# Patient Record
Sex: Male | Born: 1965 | Race: White | Hispanic: No | Marital: Married | Smoking: Never smoker
Health system: Southern US, Community
[De-identification: ages and names within clinical notes are randomized; demographics above are authoritative.]

---

## 2016-03-02 ENCOUNTER — Emergency Department
Admission: EM | Admit: 2016-03-02 | Discharge: 2016-03-02 | Disposition: A | Discharge status: Home / Self Care | Payer: Self-pay | Attending: Emergency Medicine | Admitting: Emergency Medicine

## 2016-03-02 ENCOUNTER — Encounter: Payer: Self-pay | Admitting: Emergency Medicine

## 2016-03-02 DIAGNOSIS — J111 Influenza due to unidentified influenza virus with other respiratory manifestations: Secondary | ICD-10-CM | POA: Insufficient documentation

## 2016-03-02 MED ORDER — OSELTAMIVIR PHOSPHATE 75 MG PO CAPS: 75.0000 mg | ORAL_CAPSULE | Freq: Two times a day (BID) | ORAL | 0 refills | Status: AC

## 2016-03-02 MED ORDER — FLUTICASONE PROPIONATE 50 MCG/ACT NA SUSP: 1.0000 | Freq: Two times a day (BID) | NASAL | 0 refills | Status: AC

## 2016-03-02 MED ORDER — CETIRIZINE HCL 10 MG PO TABS: 10.0000 mg | ORAL_TABLET | Freq: Every day | ORAL | 0 refills | Status: AC

## 2016-03-02 MED ORDER — PSEUDOEPH-BROMPHEN-DM 30-2-10 MG/5ML PO SYRP: 10.0000 mL | ORAL_SOLUTION | Freq: Four times a day (QID) | ORAL | 0 refills | Status: AC | PRN

## 2016-03-02 NOTE — ED Notes (Signed)
See triage note  States he developed fever, body aches and congestion since last pm  Afebrile on arrival

## 2016-03-02 NOTE — ED Triage Notes (Signed)
Cough, fever and chills since yesterday

## 2016-03-02 NOTE — ED Provider Notes (Signed)
[facility] Emergency Department Provider Note  ____________________________________________  Time seen: Approximately 6:04 PM  I have reviewed the triage vital signs and the nursing notes.   HISTORY  Chief Complaint Cough    HPI Isaac Cruz is a 51 y.o. male who presents to emergency department complaining of sudden onset of fevers and chills, nasal congestion, cough, headache. Patient reports that yesterday evening all symptoms hit rapidly. Patient has had intermittent "burning up, versus "freezing". Patient is been taking Alka-Seltzer and Motrin for complaints. Patient reports that today at work he felt foggy headed and his body started to ache even more. He denies any visual changes, neck stiffness or pain, chest pain, shortness of breath, abdominal pain, nausea vomiting, diarrhea or constipation.   History reviewed. No pertinent past medical history.  There are no active problems to display for this patient.   History reviewed. No pertinent surgical history.  Prior to Admission medications   Medication Sig Start Date End Date Taking? Authorizing Provider  levothyroxine (SYNTHROID, LEVOTHROID) 100 MCG tablet Take 100 mcg by mouth daily before breakfast.   Yes Historical Provider, MD  brompheniramine-pseudoephedrine-DM 30-2-10 MG/5ML syrup Take 10 mLs by mouth 4 (four) times daily as needed. 03/02/16   Delorise Royals Cuthriell, PA-C  cetirizine (ZYRTEC) 10 MG tablet Take 1 tablet (10 mg total) by mouth daily. 03/02/16   Delorise Royals Cuthriell, PA-C  fluticasone (FLONASE) 50 MCG/ACT nasal spray Place 1 spray into both nostrils 2 (two) times daily. 03/02/16   Delorise Royals Cuthriell, PA-C  oseltamivir (TAMIFLU) 75 MG capsule Take 1 capsule (75 mg total) by mouth 2 (two) times daily. 03/02/16   Delorise Royals Cuthriell, PA-C    Allergies Patient has no known allergies.  No family history on file.  Social History Social History  Substance Use Topics  . Smoking  status: Never Smoker  . Smokeless tobacco: Not on file  . Alcohol use Not on file     Review of Systems  Constitutional: Positive fever/chills Eyes: No visual changes. No discharge ENT: Positive for nasal congestion. Cardiovascular: no chest pain. Respiratory: Positive cough. No SOB. Gastrointestinal: No abdominal pain.  No nausea, no vomiting.  No diarrhea.  No constipation. Musculoskeletal: Negative for musculoskeletal pain. Skin: Negative for rash, abrasions, lacerations, ecchymosis. Neurological: Negative for headaches, focal weakness or numbness. 10-point ROS otherwise negative.  ____________________________________________   PHYSICAL EXAM:  VITAL SIGNS: ED Triage Vitals  Enc Vitals Group     BP 03/02/16 1745 128/64     Pulse Rate 03/02/16 1745 83     Resp 03/02/16 1745 20     Temp 03/02/16 1745 98.7 F (37.1 C)     Temp Source 03/02/16 1745 Oral     SpO2 03/02/16 1745 95 %     Weight 03/02/16 1747 189 lb (85.7 kg)     Height 03/02/16 1747 5\' 5"  (1.651 m)     Head Circumference --      Peak Flow --      Pain Score 03/02/16 1747 9     Pain Loc --      Pain Edu? --      Excl. in GC? --      Constitutional: Alert and oriented. Well appearing and in no acute distress. Eyes: Conjunctivae are normal. PERRL. EOMI. Head: Atraumatic. ENT:      Ears: EACs and TMs unremarkable bilaterally.      Nose: Mild clear congestion/rhinnorhea.      Mouth/Throat: Mucous membranes are moist.  Neck: No stridor.  No cervical spine tenderness to palpation. Hematological/Lymphatic/Immunilogical: No cervical lymphadenopathy. Cardiovascular: Normal rate, regular rhythm. Normal S1 and S2.  Good peripheral circulation. Respiratory: Normal respiratory effort without tachypnea or retractions. Lungs CTAB. Good air entry to the bases with no decreased or absent breath sounds. Musculoskeletal: Full range of motion to all extremities. No gross deformities appreciated. Neurologic:  Normal  speech and language. No gross focal neurologic deficits are appreciated.  Skin:  Skin is warm, dry and intact. No rash noted. Psychiatric: Mood and affect are normal. Speech and behavior are normal. Patient exhibits appropriate insight and judgement.   ____________________________________________   LABS (all labs ordered are listed, but only abnormal results are displayed)  Labs Reviewed - No data to display ____________________________________________  EKG   ____________________________________________  RADIOLOGY   No results found.  ____________________________________________    PROCEDURES  Procedure(s) performed:    Procedures    Medications - No data to display   ____________________________________________   INITIAL IMPRESSION / ASSESSMENT AND PLAN / ED COURSE  Pertinent labs & imaging results that were available during my care of the patient were reviewed by me and considered in my medical decision making (see chart for details).  Review of the [place] CSRS was performed in accordance of the [facility] prior to dispensing any controlled drugs.     Patient's diagnosis is consistent with influenza. Patient has some onset of influenza-like symptoms. At this time, no testing is undertaken. Patient will be treated symptomatically. He is given a prescription for Tamiflu, Bromfed, Flonase, Zyrtec. He is encouraged to use Tylenol and Motrin at home for further symptom control. Patient has been given strict instructions to maintain good oral intake of liquids throughout this process. Patient will follow-up with primary care as needed..  Patient is given ED precautions to return to the ED for any worsening or new symptoms.     ____________________________________________  FINAL CLINICAL IMPRESSION(S) / ED DIAGNOSES  Final diagnoses:  Influenza      NEW MEDICATIONS STARTED DURING THIS VISIT:  New Prescriptions   BROMPHENIRAMINE-PSEUDOEPHEDRINE-DM 30-2-10 MG/5ML  SYRUP    Take 10 mLs by mouth 4 (four) times daily as needed.   CETIRIZINE (ZYRTEC) 10 MG TABLET    Take 1 tablet (10 mg total) by mouth daily.   FLUTICASONE (FLONASE) 50 MCG/ACT NASAL SPRAY    Place 1 spray into both nostrils 2 (two) times daily.   OSELTAMIVIR (TAMIFLU) 75 MG CAPSULE    Take 1 capsule (75 mg total) by mouth 2 (two) times daily.        This chart was dictated using voice recognition software/Dragon. Despite best efforts to proofread, errors can occur which can change the meaning. Any change was purely unintentional.    Racheal PatchesJonathan D Cuthriell, PA-C 03/02/16 1809    Sharyn CreamerMark Quale, MD 03/02/16 2042

## 2016-03-08 ENCOUNTER — Emergency Department: Payer: Self-pay

## 2016-03-08 ENCOUNTER — Encounter: Payer: Self-pay | Admitting: Emergency Medicine

## 2016-03-08 ENCOUNTER — Emergency Department
Admission: EM | Admit: 2016-03-08 | Discharge: 2016-03-08 | Disposition: A | Discharge status: Home / Self Care | Payer: Self-pay | Attending: Emergency Medicine | Admitting: Emergency Medicine

## 2016-03-08 DIAGNOSIS — Z23 Encounter for immunization: Secondary | ICD-10-CM | POA: Insufficient documentation

## 2016-03-08 DIAGNOSIS — S0990XA Unspecified injury of head, initial encounter: Secondary | ICD-10-CM | POA: Insufficient documentation

## 2016-03-08 DIAGNOSIS — Y9259 Other trade areas as the place of occurrence of the external cause: Secondary | ICD-10-CM | POA: Insufficient documentation

## 2016-03-08 DIAGNOSIS — R55 Syncope and collapse: Secondary | ICD-10-CM

## 2016-03-08 DIAGNOSIS — Y999 Unspecified external cause status: Secondary | ICD-10-CM | POA: Insufficient documentation

## 2016-03-08 DIAGNOSIS — S63282A Dislocation of proximal interphalangeal joint of right middle finger, initial encounter: Secondary | ICD-10-CM | POA: Insufficient documentation

## 2016-03-08 DIAGNOSIS — Z79899 Other long term (current) drug therapy: Secondary | ICD-10-CM | POA: Insufficient documentation

## 2016-03-08 DIAGNOSIS — W01198A Fall on same level from slipping, tripping and stumbling with subsequent striking against other object, initial encounter: Secondary | ICD-10-CM | POA: Insufficient documentation

## 2016-03-08 DIAGNOSIS — Y9389 Activity, other specified: Secondary | ICD-10-CM | POA: Insufficient documentation

## 2016-03-08 DIAGNOSIS — S62609A Fracture of unspecified phalanx of unspecified finger, initial encounter for closed fracture: Secondary | ICD-10-CM

## 2016-03-08 MED ORDER — LIDOCAINE HCL (PF) 1 % IJ SOLN
5.0000 mL | Freq: Once | INTRAMUSCULAR | Status: DC
  Filled 2016-03-08: qty 5

## 2016-03-08 MED ORDER — TETANUS-DIPHTH-ACELL PERTUSSIS 5-2.5-18.5 LF-MCG/0.5 IM SUSP
0.5000 mL | Freq: Once | INTRAMUSCULAR | Status: AC
  Administered 2016-03-08: 0.5 mL via INTRAMUSCULAR
  Filled 2016-03-08: qty 0.5

## 2016-03-08 NOTE — ED Notes (Signed)
Patient transported to CT 

## 2016-03-08 NOTE — ED Notes (Signed)
Returned to room.
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
## 2016-03-08 NOTE — ED Notes (Signed)
MD in with patient for digital block of 3rd finger on right hand.
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
## 2016-03-08 NOTE — Discharge Instructions (Signed)
You have been seen in the Emergency Department (ED) today for a fall and head contusion.  Your work up does not show any concerning injuries.  Please take over-the-counter ibuprofen and/or Tylenol as needed for your pain (unless you have an allergy or your doctor as told you not to take them), or take any prescribed medication as instructed.  You did, however, dislocate the middle (PIP) joint of the middle finger of your right hand, and a small part of the bone appears to be broken.  We put it back in place and immobilized it with a finger splint and buddy tape, but we encourage you to follow up with Dr. Mathis BudHernandez-Soria at the next available opportuntiy.  She is our local hand specialist and we suggest you call her office on Monday to schedule the next available appointment.   Please read through the included information about finger dislocations.  Return to the ED if you have any headache, confusion, slurred speech, weakness/numbness of any arm or leg, or any increased pain.

## 2016-03-08 NOTE — ED Notes (Signed)
Patient and family resting quietly with eyes closed.  No acute distress noted.
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
## 2019-01-24 ENCOUNTER — Other Ambulatory Visit: Payer: Self-pay

## 2019-01-24 ENCOUNTER — Ambulatory Visit: Payer: 59 | Attending: Internal Medicine

## 2019-01-24 DIAGNOSIS — Z20822 Contact with and (suspected) exposure to covid-19: Secondary | ICD-10-CM

## 2019-01-25 LAB — NOVEL CORONAVIRUS, NAA: SARS-CoV-2, NAA: DETECTED — AB
